# Patient Record
Sex: Male | Born: 1975 | Race: White | Hispanic: No | Marital: Married | Smoking: Never smoker
Health system: Southern US, Community
[De-identification: ages and names within clinical notes are randomized; demographics above are authoritative.]

## PROBLEM LIST (undated history)

## (undated) DIAGNOSIS — I440 Atrioventricular block, first degree: Secondary | ICD-10-CM

## (undated) HISTORY — DX: Atrioventricular block, first degree: I44.0

## (undated) HISTORY — PX: OTHER SURGICAL HISTORY: SHX169

---

## 2011-07-04 ENCOUNTER — Emergency Department (HOSPITAL_BASED_OUTPATIENT_CLINIC_OR_DEPARTMENT_OTHER)
Admission: EM | Admit: 2011-07-04 | Discharge: 2011-07-04 | Disposition: A | Discharge status: Home / Self Care | Payer: BC Managed Care – PPO | Attending: Emergency Medicine | Admitting: Emergency Medicine

## 2011-07-04 ENCOUNTER — Encounter: Payer: Self-pay | Admitting: *Deleted

## 2011-07-04 DIAGNOSIS — R509 Fever, unspecified: Secondary | ICD-10-CM | POA: Insufficient documentation

## 2011-07-04 DIAGNOSIS — J020 Streptococcal pharyngitis: Secondary | ICD-10-CM | POA: Insufficient documentation

## 2011-07-04 LAB — RAPID STREP SCREEN (MED CTR MEBANE ONLY): Streptococcus, Group A Screen (Direct): NEGATIVE

## 2011-07-04 MED ORDER — PENICILLIN G BENZATHINE 1200000 UNIT/2ML IM SUSP
INTRAMUSCULAR | Status: AC
  Administered 2011-07-04: 1.2 10*6.[IU] via INTRAMUSCULAR
  Filled 2011-07-04: qty 2

## 2011-07-04 MED ORDER — PENICILLIN G BENZATHINE 1200000 UNIT/2ML IM SUSP
1.2000 10*6.[IU] | Freq: Once | INTRAMUSCULAR | Status: AC
  Administered 2011-07-04: 1.2 10*6.[IU] via INTRAMUSCULAR

## 2011-07-04 NOTE — ED Provider Notes (Signed)
I saw and evaluated the patient, reviewed the resident's note and I agree with the findings and plan.   .Face to face Exam:  General:  Awake HEENT:  Atraumatic Resp:  Normal effort Abd:  Nondistended Neuro:No focal weakness Lymph: No adenopathy   Nelia Shi, MD 07/04/11 1504

## 2011-07-04 NOTE — ED Provider Notes (Addendum)
History     CSN: [id number] Arrival date & time: 07/04/2011  7:31 AM   First MD Initiated Contact with Patient 07/04/11 [phone]      Chief Complaint  Patient presents with  . Sore Throat  . Fever    (Consider location/radiation/quality/duration/timing/severity/associated sxs/prior treatment) HPI 35 year old otherwise healthy man who has been feeling poorly for roughly 5 days. Patient says he started with a sore throat, developed subjective fevers over the weekend, and now is having extremely painful throat both with swallowing and at rest. He denies rhinorrhea or cough, but says he has a swollen lymph node with pain radiating into the right ear. He denies any sick contacts. He is otherwise healthy.  History reviewed. No pertinent past medical history.  History reviewed. No pertinent past surgical history.  History reviewed. No pertinent family history.  History  Substance Use Topics  . Smoking status: Never Smoker   . Smokeless tobacco: Not on file  . Alcohol Use: Yes     Review of Systems  Allergies  Review of patient's allergies indicates no known allergies.  Home Medications  No current outpatient prescriptions on file.  BP 154/77  Pulse 77  Temp(Src) 98.7 F (37.1 C) (Oral)  Resp 20  SpO2 100%  Physical Exam Gen: NAD, cooperative HEENT: NCAT, R TM partially obscrubed by cerumen, but visualized portion was clear and non-erythematous.  L TM completely obscured.  Oropharynx erythematous, bilateral tonsillar erythema with exudate and edema. Positive lymphadenopathy on the right submandibular region. EOMI, PERRLA. ED Course  Procedures (including critical care time)   Labs Reviewed  RAPID STREP SCREEN   No results found.   No diagnosis found.    MDM  35 year old otherwise healthy male complaining of acute pharyngitis who meets all of the centor criteria. A rapid strep was already ordered, but we will give him antibiotics regardless of the results given his  score of 4. Patient prefers injection of antibiotics IM here in the ED, so we will proceed with 1.2 million units of long-acting Bicillin x1 now.   Kathreen Cosier, MD 07/04/11 8295  Kathreen Cosier, MD 07/04/11 [phone]

## 2011-07-04 NOTE — ED Notes (Signed)
Pt amb to room 4 with quick steady gait in nad. Pt reports throat pain onset Friday night, increasing with intermittant fevers at home, taking tylenol, last dose yesterday. Denies any cough, congestion or other c/o.

## 2011-07-06 ENCOUNTER — Encounter (HOSPITAL_BASED_OUTPATIENT_CLINIC_OR_DEPARTMENT_OTHER): Payer: Self-pay | Admitting: Emergency Medicine

## 2011-07-06 ENCOUNTER — Emergency Department (HOSPITAL_BASED_OUTPATIENT_CLINIC_OR_DEPARTMENT_OTHER)
Admission: EM | Admit: 2011-07-06 | Discharge: 2011-07-06 | Disposition: A | Discharge status: Home / Self Care | Payer: BC Managed Care – PPO | Attending: Emergency Medicine | Admitting: Emergency Medicine

## 2011-07-06 DIAGNOSIS — J029 Acute pharyngitis, unspecified: Secondary | ICD-10-CM

## 2011-07-06 LAB — MONONUCLEOSIS SCREEN: Mono Screen: NEGATIVE

## 2011-07-06 MED ORDER — HYDROCODONE-ACETAMINOPHEN 5-500 MG PO TABS: 1.0000 | ORAL_TABLET | Freq: Four times a day (QID) | ORAL | Status: AC | PRN

## 2011-07-06 MED ORDER — SODIUM CHLORIDE 0.9 % IV BOLUS (SEPSIS)
1000.0000 mL | Freq: Once | INTRAVENOUS | Status: AC
  Administered 2011-07-06: 1000 mL via INTRAVENOUS

## 2011-07-06 NOTE — ED Provider Notes (Signed)
History     CSN: [id number] Arrival date & time: 07/06/2011  4:30 PM   First MD Initiated Contact with Patient 07/06/11 1639      Chief Complaint  Patient presents with  . Sore Throat    (Consider location/radiation/quality/duration/timing/severity/associated sxs/prior treatment) HPI Comments: Pt states that he was seen 2 days ago and treated for strep:pt states that usually he feels better but he is continuing to not feel good at this time  Patient is a 35 y.o. male presenting with pharyngitis. The history is provided by the patient. No language interpreter was used.  Sore Throat This is a new problem. The current episode started in the past 7 days. The problem occurs constantly. The problem has been unchanged. Associated symptoms include anorexia, fatigue and a fever. Pertinent negatives include no abdominal pain or coughing. The symptoms are aggravated by swallowing. He has tried nothing for the symptoms.  Sore Throat This is a new problem. The current episode started in the past 7 days. The problem occurs constantly. The problem has been unchanged. Pertinent negatives include no abdominal pain. The symptoms are aggravated by swallowing. He has tried nothing for the symptoms.    History reviewed. No pertinent past medical history.  History reviewed. No pertinent past surgical history.  No family history on file.  History  Substance Use Topics  . Smoking status: Never Smoker   . Smokeless tobacco: Not on file  . Alcohol Use: Yes     weekly      Review of Systems  Constitutional: Positive for fever and fatigue.  Respiratory: Negative for cough.   Gastrointestinal: Positive for anorexia. Negative for abdominal pain.  All other systems reviewed and are negative.    Allergies  Review of patient's allergies indicates no known allergies.  Home Medications  No current outpatient prescriptions on file.  BP 144/81  Pulse 84  Temp(Src) 98.9 F (37.2 C) (Oral)  Resp 16   Ht 5\' 10"  (1.778 m)  Wt 211 lb (95.709 kg)  BMI 30.28 kg/m2  SpO2 99%  Physical Exam  Nursing note and vitals reviewed. Constitutional: He is oriented to person, place, and time. He appears well-developed and well-nourished.  HENT:  Right Ear: External ear normal.  Left Ear: External ear normal.  Mouth/Throat: No tonsillar abscesses.       Pt has swelling and exudate noted to the right tonsil without abscess at this time  Cardiovascular: Normal rate and regular rhythm.   Pulmonary/Chest: Effort normal and breath sounds normal.  Abdominal: Soft.  Musculoskeletal: Normal range of motion.  Neurological: He is oriented to person, place, and time.  Skin: Skin is warm and dry.    ED Course  Procedures (including critical care time)   Labs Reviewed  MONONUCLEOSIS SCREEN   No results found.   1. Pharyngitis       MDM  Pt treated 2 days go for strep:mono negative:pt is okay to follow up as needed:will treat symptomatically:no sign of tonsillar abscess at this time     Medical screening examination/treatment/procedure(s) were performed by non-physician practitioner and as supervising physician I was immediately available for consultation/collaboration. Osvaldo Human, M.D.    Teressa Lower, NP 07/06/11 1841  Carleene Cooper III, MD 07/06/11 2107

## 2011-07-06 NOTE — ED Notes (Signed)
Pt states was seen in ER Wed. And has not improved from that visit. C/O sore throat fever and "sweats" since seen. Denies c/o SOB, reports feeling weak and loss of appetite.

## 2015-04-03 ENCOUNTER — Encounter (HOSPITAL_BASED_OUTPATIENT_CLINIC_OR_DEPARTMENT_OTHER): Payer: Self-pay | Admitting: Emergency Medicine

## 2015-04-03 ENCOUNTER — Emergency Department (HOSPITAL_BASED_OUTPATIENT_CLINIC_OR_DEPARTMENT_OTHER): Payer: BLUE CROSS/BLUE SHIELD

## 2015-04-03 ENCOUNTER — Emergency Department (HOSPITAL_BASED_OUTPATIENT_CLINIC_OR_DEPARTMENT_OTHER)
Admission: EM | Admit: 2015-04-03 | Discharge: 2015-04-03 | Disposition: A | Discharge status: Home / Self Care | Payer: BLUE CROSS/BLUE SHIELD | Attending: Emergency Medicine | Admitting: Emergency Medicine

## 2015-04-03 DIAGNOSIS — R42 Dizziness and giddiness: Secondary | ICD-10-CM | POA: Insufficient documentation

## 2015-04-03 DIAGNOSIS — R0602 Shortness of breath: Secondary | ICD-10-CM | POA: Insufficient documentation

## 2015-04-03 LAB — CBC WITH DIFFERENTIAL/PLATELET
BASOS ABS: 0 10*3/uL (ref 0.0–0.1)
BASOS PCT: 0 % (ref 0–1)
Eosinophils Absolute: 0.2 10*3/uL (ref 0.0–0.7)
Eosinophils Relative: 2 % (ref 0–5)
HEMATOCRIT: 40.6 % (ref 39.0–52.0)
HEMOGLOBIN: 14.3 g/dL (ref 13.0–17.0)
Lymphocytes Relative: 24 % (ref 12–46)
Lymphs Abs: 2.2 10*3/uL (ref 0.7–4.0)
MCH: 30.7 pg (ref 26.0–34.0)
MCHC: 35.2 g/dL (ref 30.0–36.0)
MCV: 87.1 fL (ref 78.0–100.0)
Monocytes Absolute: 0.9 10*3/uL (ref 0.1–1.0)
Monocytes Relative: 10 % (ref 3–12)
NEUTROS ABS: 5.7 10*3/uL (ref 1.7–7.7)
NEUTROS PCT: 64 % (ref 43–77)
Platelets: 263 10*3/uL (ref 150–400)
RBC: 4.66 MIL/uL (ref 4.22–5.81)
RDW: 11.6 % (ref 11.5–15.5)
WBC: 9 10*3/uL (ref 4.0–10.5)

## 2015-04-03 LAB — BASIC METABOLIC PANEL
ANION GAP: 8 (ref 5–15)
BUN: 14 mg/dL (ref 6–20)
CALCIUM: 9.1 mg/dL (ref 8.9–10.3)
CO2: 29 mmol/L (ref 22–32)
Chloride: 101 mmol/L (ref 101–111)
Creatinine, Ser: 0.88 mg/dL (ref 0.61–1.24)
Glucose, Bld: 111 mg/dL — ABNORMAL HIGH (ref 65–99)
Potassium: 3.6 mmol/L (ref 3.5–5.1)
Sodium: 138 mmol/L (ref 135–145)

## 2015-04-03 LAB — D-DIMER, QUANTITATIVE (NOT AT ARMC)

## 2015-04-03 LAB — TROPONIN I: Troponin I: <0.03 ng/mL (ref <0.031)

## 2015-04-03 MED ORDER — IOHEXOL 350 MG/ML SOLN
100.0000 mL | Freq: Once | INTRAVENOUS | Status: AC | PRN
  Administered 2015-04-03: 100 mL via INTRAVENOUS

## 2015-04-03 MED ORDER — ALBUTEROL SULFATE (2.5 MG/3ML) 0.083% IN NEBU
5.0000 mg | INHALATION_SOLUTION | Freq: Once | RESPIRATORY_TRACT | Status: DC
Start: 2015-04-03 — End: 2015-04-03

## 2015-04-03 NOTE — ED Notes (Signed)
Pt reports sob and tachycardia while flying to [place] on tues, returned on Saturday reports acute onset of sob, dizziness and tachycardia

## 2015-04-03 NOTE — ED Notes (Signed)
Pt's SpO2 remained between 97% and 98% when walking.

## 2015-04-03 NOTE — Discharge Instructions (Signed)

## 2015-04-03 NOTE — ED Provider Notes (Signed)
CSN: [id number]     Arrival date & time 04/03/15  1308 History   First MD Initiated Contact with Patient 04/03/15 1324     Chief Complaint  Patient presents with  . Shortness of Breath    HPI  39 yo healthy male presents with acute onset dyspnea on exertion that started 3 days ago after a long 9 hour flight to [place]. He has an apple watch that showed his resting heart rate had increased from 60s to 90s since that time. Otherwise he denies chest pain, leg pain or swelling, wheezing, fevers, or cough.  History reviewed. No pertinent past medical history. History reviewed. No pertinent past surgical history. History reviewed. No pertinent family history.   Social History  Substance Use Topics  . Smoking status: Never Smoker   . Smokeless tobacco: None  . Alcohol Use: Yes     Comment: weekly    Review of Systems  Constitutional: Negative for fever and chills.  Respiratory: Positive for shortness of breath. Negative for chest tightness and wheezing.   Cardiovascular: Negative for chest pain, palpitations and leg swelling.  Gastrointestinal: Negative for abdominal pain.  Neurological: Positive for light-headedness. Negative for dizziness.  Hematological: Negative for adenopathy.    Allergies  Review of patient's allergies indicates no known allergies.  Home Medications   Prior to Admission medications   Not on File   BP 147/76 mmHg  Pulse 74  Temp(Src) 98.2 F (36.8 C) (Oral)  Resp 18  Ht 5\' 10"  (1.778 m)  Wt 217 lb (98.431 kg)  BMI 31.14 kg/m2  SpO2 98%   Physical Exam  Constitutional: He appears well-developed and well-nourished.  HENT:  Head: Normocephalic and atraumatic.  Eyes: Conjunctivae and EOM are normal.  Neck: Normal range of motion. Neck supple.  Cardiovascular: Normal rate, regular rhythm and normal heart sounds.   No murmur heard. Pulmonary/Chest: Effort normal and breath sounds normal. He has no wheezes.  Abdominal: Soft. Bowel sounds are normal.   Musculoskeletal: Normal range of motion.  Skin: Skin is warm and dry.  Psychiatric: He has a normal mood and affect.    ED Course  Procedures (including critical care time)  Labs Review Labs Reviewed  BASIC METABOLIC PANEL - Abnormal; Notable for the following:    Glucose, Bld 111 (*)    All other components within normal limits  D-DIMER, QUANTITATIVE (NOT AT [facility])  CBC WITH DIFFERENTIAL/PLATELET  TROPONIN I    Imaging Review No results found. I have personally reviewed and evaluated these images and lab results as part of my medical decision-making.  EKG: Rate 90, first degree AV block, normal axis, no ST changes  MDM   Final diagnoses:  Shortness of breath    Healthy 39 year old male presented with dyspnea on exertion after a long flight; D-dimer was negative, EKG showed only first degree AV block, and Chest CTA was negative for pulmonary disease but could not confidently rule out PE because the contrast timing was off. We felt confident he was NOT having a PE because his D-dimer was negative. Cardiac causes is unlikely given low HEART score of 1, EKG showing only first degree AV block, and negative troponin. Moreover, I don't have a great idea what's causing his dyspnea on exertion so I recommended he get a PCP or cardiologist and try to get an appointment later this week. They may feel he qualifies for a stress test. Regardless, it would be a good idea to establish care.  Selina Cooley,  MD 04/03/15 1610  Rolan Bucco, MD 04/03/15 1539

## 2015-04-05 ENCOUNTER — Encounter: Payer: Self-pay | Admitting: *Deleted

## 2015-04-05 ENCOUNTER — Telehealth: Payer: Self-pay | Admitting: *Deleted

## 2015-04-05 NOTE — Telephone Encounter (Signed)
no answer, need fam med hx, medical & surgical hx.. pt coming over from hospital..

## 2015-04-06 ENCOUNTER — Ambulatory Visit: Payer: BLUE CROSS/BLUE SHIELD | Admitting: Cardiology

## 2015-04-11 ENCOUNTER — Telehealth: Payer: Self-pay | Admitting: Internal Medicine

## 2015-04-11 ENCOUNTER — Encounter: Payer: Self-pay | Admitting: Internal Medicine

## 2015-04-11 NOTE — Telephone Encounter (Signed)
Called pt to get medical and family history from the pt and also reminded him about his upcoming appointment on 04/15/2015 at 2:30 pm. I advised the pt that if he has any other problems, questions or concerns to call the office. Pt verbalized understanding.

## 2015-04-15 ENCOUNTER — Ambulatory Visit (INDEPENDENT_AMBULATORY_CARE_PROVIDER_SITE_OTHER): Payer: BLUE CROSS/BLUE SHIELD | Admitting: Internal Medicine

## 2015-04-15 ENCOUNTER — Encounter: Payer: Self-pay | Admitting: Internal Medicine

## 2015-04-15 VITALS — BP 126/78 | HR 72 | Wt 224.0 lb

## 2015-04-15 DIAGNOSIS — R0602 Shortness of breath: Secondary | ICD-10-CM

## 2015-04-15 NOTE — Patient Instructions (Signed)
Your physician recommends that you schedule a follow-up appointment as needed with Dr. Ross.   

## 2015-04-15 NOTE — Progress Notes (Signed)
   Cardiology Office Note   Date:  04/15/2015   ID:  Mike Nicholson, DOB 1975/10/15, MRN [id number]  PCP:  No primary care provider on file.  Cardiologist:   Dietrich Pates, MD   No chief complaint on file.  Pt presents for continued f/u from ER     History of Present Illness: Mike Nicholson is a 39 y.o. male with a history of SOB   Referred from ER for continued eval  Pt went to ER on 9/4 with 3 day history of SOB  Prior to that had 9 hour flight.    No CP  D dimer negative   CT of chest was suboptimal in timing but no obvious PE found     Since ER visit he is increasing his activity with jogging  Feels that his breathing is improving    He denies CP      No current outpatient prescriptions on file.   No current facility-administered medications for this visit.    Allergies:   Review of patient's allergies indicates no known allergies.   Past Medical History  Diagnosis Date  . First degree AV block     Past Surgical History  Procedure Laterality Date  . None       Social History:  The patient does not smoke  Does use wet tobacco for chew  Family History:  The patient has no history of premature CAD in family    ROS:  Please see the history of present illness. All other systems are reviewed and  Negative to the above problem except as noted.    PHYSICAL EXAM: VS:  BP 126/78 mmHg  Pulse 72  Wt 224 lb (101.606 kg)  SpO2 97%  GEN: Well nourished, well developed, in no acute distress HEENT: normal Neck: no JVD, carotid bruits, or masses Cardiac: RRR; no murmurs, rubs, or gallops,no edema  Respiratory:  clear to auscultation bilaterally, normal work of breathing GI: soft, nontender, nondistended, + BS  No hepatomegaly  MS: no deformity Moving all extremities   Skin: warm and dry, no rash Neuro:  Strength and sensation are intact Psych: euthymic mood, full affect   EKG:  EKG is not ordered today.  On 9/4  SR 84  First degree AV bllck     Lipid Panel No  results found for: CHOL, TRIG, HDL, CHOLHDL, VLDL, LDLCALC, LDLDIRECT    Wt Readings from Last 3 Encounters:  04/15/15 224 lb (101.606 kg)  04/03/15 217 lb (98.431 kg)  07/06/11 211 lb (95.709 kg)      ASSESSMENT AND PLAN:  1.  SOB  I am not convinced there is any problem.  Breathing appears to be improving with training  Follow  If worsens or recurs then he should call.  WIll not sched any testing for now  2.  Tob  Counselled on cessation of chew tobacco  3.  HCM  WIll need to check on lipids  He will forward.    Should have IM physician  F/U with me prn     Signed, Dietrich Pates, MD  04/15/2015 3:28 PM    [facility] [address] [place], [place], [place]  [address] Phone: [phone]; Fax: [phone]

## 2017-02-25 IMAGING — CR DG CHEST 2V
2 series · 2 of 2 positions shown · non-contrast
Comparison: None.

CLINICAL DATA: Shortness of breath. Tachycardia. Recent airplane
flight.

EXAM:
CHEST  2 VIEW

[w chest pa]
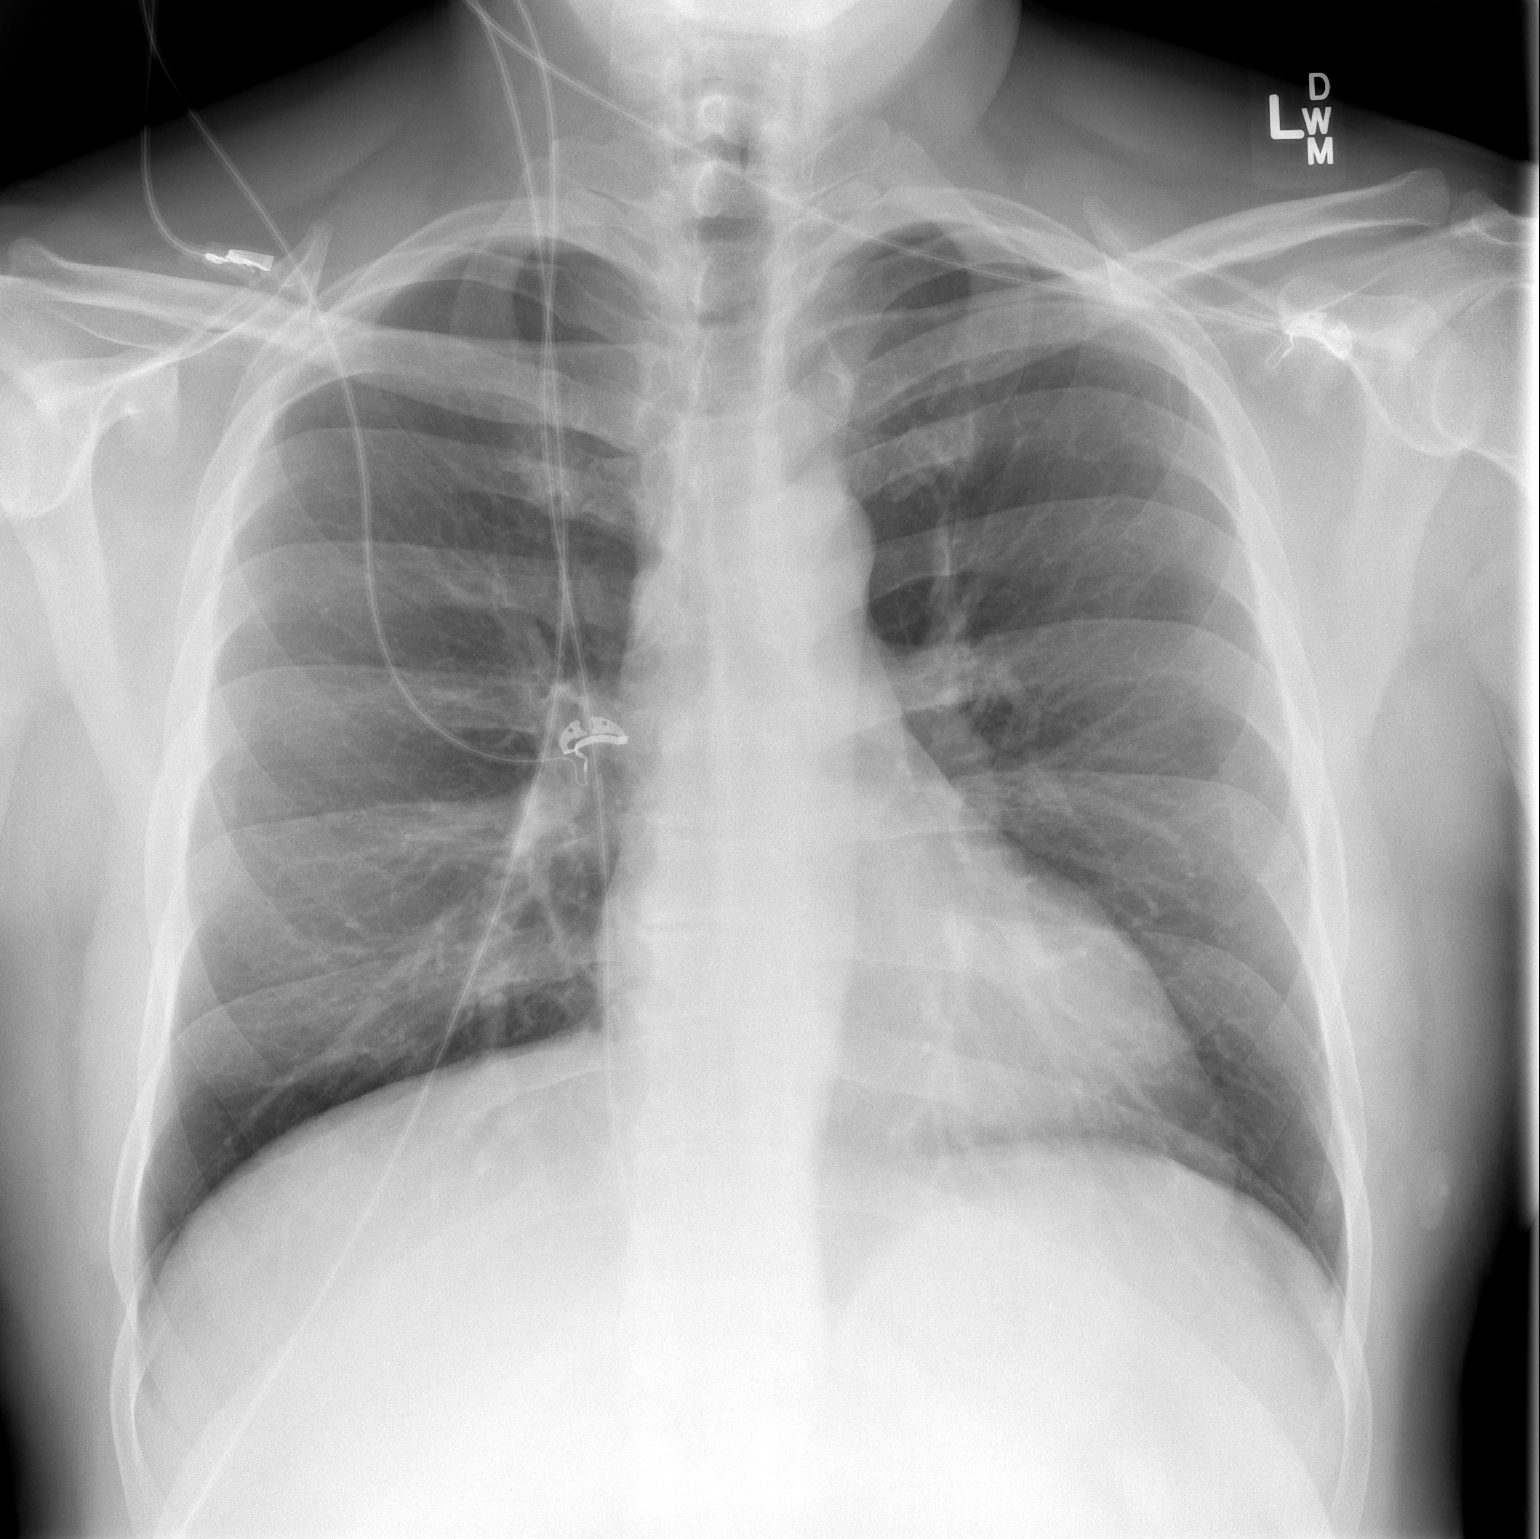

[w chest lat]
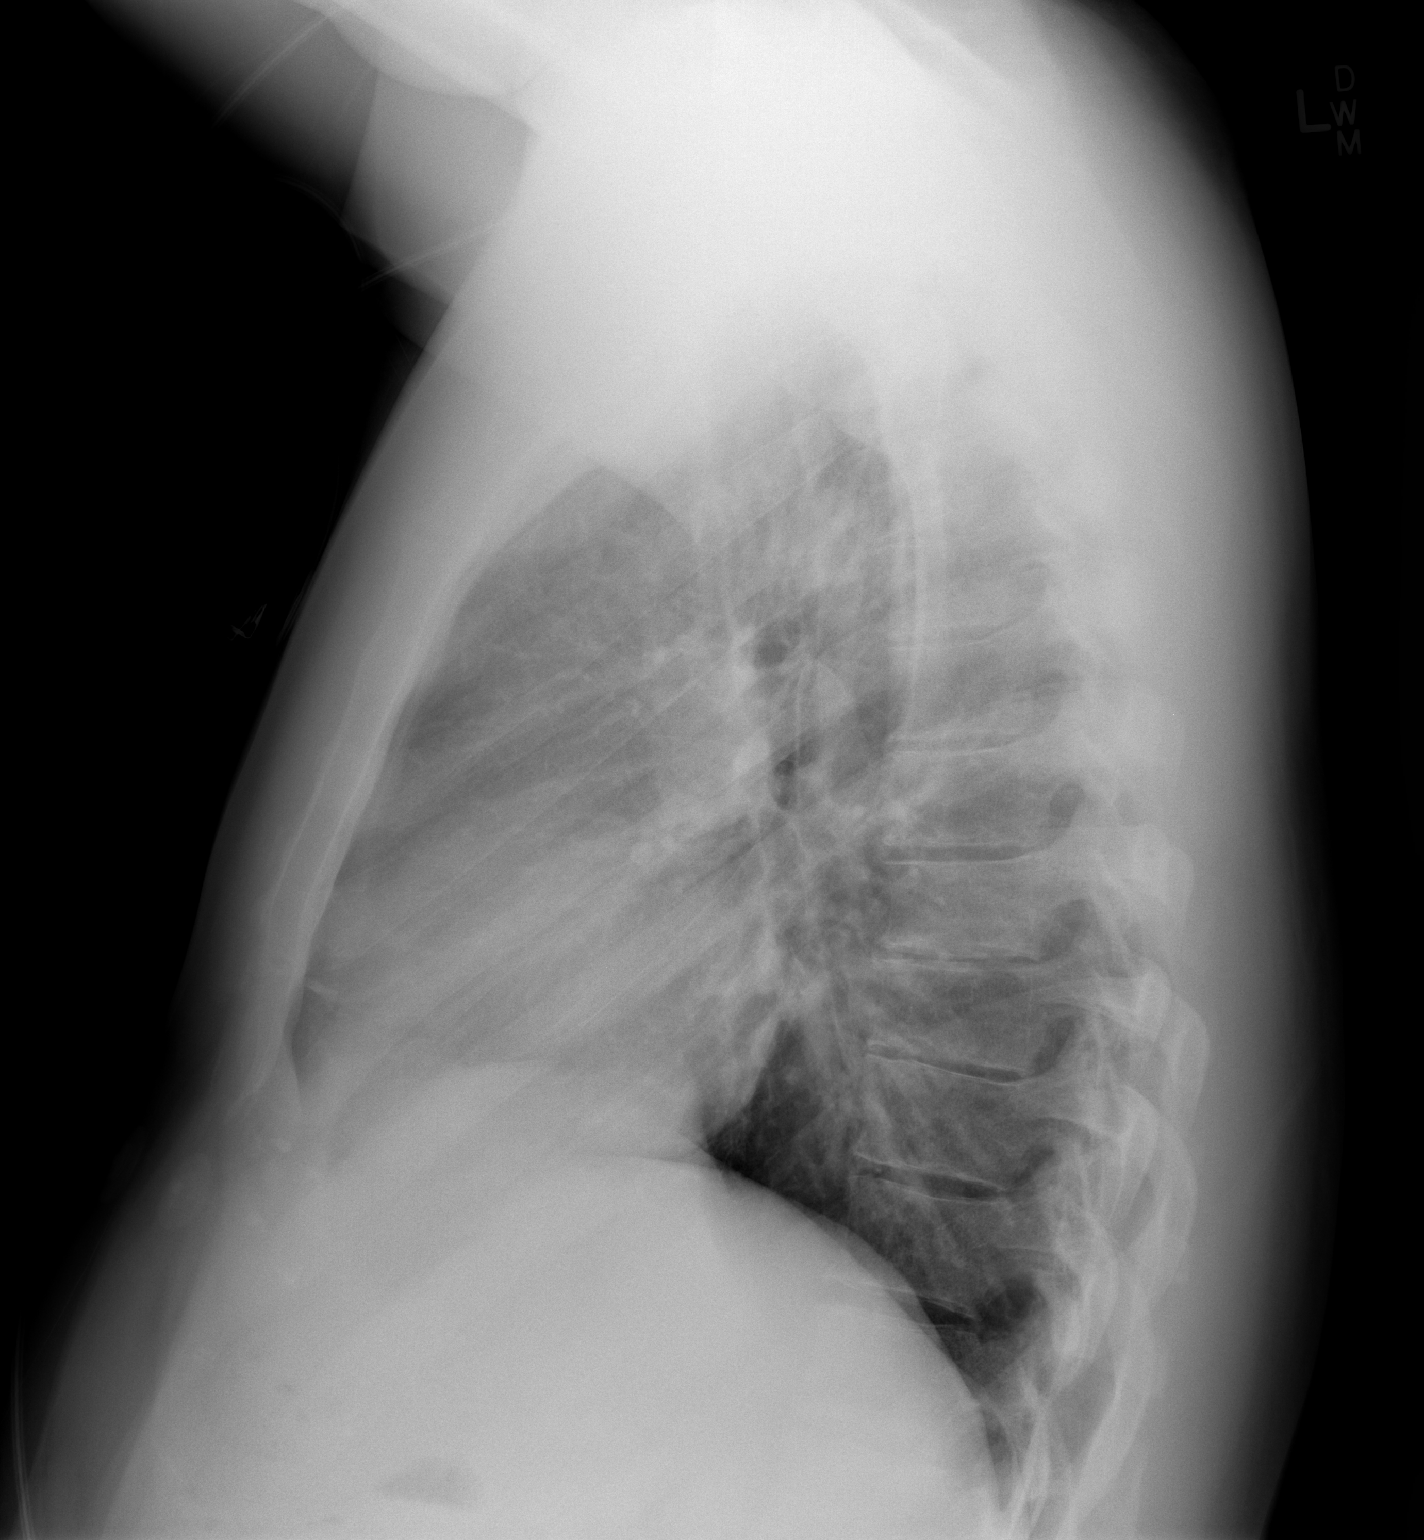

[2 of 2 positions shown; findings below may reference images not displayed]

FINDINGS: The heart size and mediastinal contours are within normal limits.
Both lungs are clear. The visualized skeletal structures are
unremarkable.
IMPRESSION: No active cardiopulmonary disease.

## 2018-07-28 DIAGNOSIS — E291 Testicular hypofunction: Secondary | ICD-10-CM | POA: Diagnosis not present

## 2018-08-28 DIAGNOSIS — F4322 Adjustment disorder with anxiety: Secondary | ICD-10-CM | POA: Diagnosis not present

## 2018-09-16 DIAGNOSIS — R5383 Other fatigue: Secondary | ICD-10-CM | POA: Diagnosis not present

## 2018-09-16 DIAGNOSIS — R7989 Other specified abnormal findings of blood chemistry: Secondary | ICD-10-CM | POA: Diagnosis not present

## 2018-09-18 DIAGNOSIS — N5201 Erectile dysfunction due to arterial insufficiency: Secondary | ICD-10-CM | POA: Diagnosis not present

## 2018-12-25 DIAGNOSIS — R6882 Decreased libido: Secondary | ICD-10-CM | POA: Diagnosis not present

## 2018-12-25 DIAGNOSIS — R5383 Other fatigue: Secondary | ICD-10-CM | POA: Diagnosis not present

## 2018-12-25 DIAGNOSIS — R635 Abnormal weight gain: Secondary | ICD-10-CM | POA: Diagnosis not present

## 2018-12-25 DIAGNOSIS — E639 Nutritional deficiency, unspecified: Secondary | ICD-10-CM | POA: Diagnosis not present

## 2018-12-25 DIAGNOSIS — E559 Vitamin D deficiency, unspecified: Secondary | ICD-10-CM | POA: Diagnosis not present

## 2018-12-30 DIAGNOSIS — E559 Vitamin D deficiency, unspecified: Secondary | ICD-10-CM | POA: Diagnosis not present

## 2018-12-30 DIAGNOSIS — R5383 Other fatigue: Secondary | ICD-10-CM | POA: Diagnosis not present

## 2018-12-30 DIAGNOSIS — E639 Nutritional deficiency, unspecified: Secondary | ICD-10-CM | POA: Diagnosis not present

## 2018-12-30 DIAGNOSIS — E291 Testicular hypofunction: Secondary | ICD-10-CM | POA: Diagnosis not present

## 2019-01-27 DIAGNOSIS — R41840 Attention and concentration deficit: Secondary | ICD-10-CM | POA: Diagnosis not present

## 2019-01-27 DIAGNOSIS — Z125 Encounter for screening for malignant neoplasm of prostate: Secondary | ICD-10-CM | POA: Diagnosis not present

## 2019-01-27 DIAGNOSIS — Z1389 Encounter for screening for other disorder: Secondary | ICD-10-CM | POA: Diagnosis not present

## 2019-01-27 DIAGNOSIS — Z0000 Encounter for general adult medical examination without abnormal findings: Secondary | ICD-10-CM | POA: Diagnosis not present

## 2019-01-27 DIAGNOSIS — Z136 Encounter for screening for cardiovascular disorders: Secondary | ICD-10-CM | POA: Diagnosis not present

## 2019-01-27 DIAGNOSIS — Z131 Encounter for screening for diabetes mellitus: Secondary | ICD-10-CM | POA: Diagnosis not present

## 2019-01-27 DIAGNOSIS — E669 Obesity, unspecified: Secondary | ICD-10-CM | POA: Diagnosis not present

## 2019-02-02 DIAGNOSIS — R5383 Other fatigue: Secondary | ICD-10-CM | POA: Diagnosis not present

## 2019-02-02 DIAGNOSIS — N5089 Other specified disorders of the male genital organs: Secondary | ICD-10-CM | POA: Diagnosis not present

## 2019-02-02 DIAGNOSIS — R6882 Decreased libido: Secondary | ICD-10-CM | POA: Diagnosis not present

## 2019-03-04 DIAGNOSIS — F909 Attention-deficit hyperactivity disorder, unspecified type: Secondary | ICD-10-CM | POA: Diagnosis not present

## 2019-04-07 DIAGNOSIS — F909 Attention-deficit hyperactivity disorder, unspecified type: Secondary | ICD-10-CM | POA: Diagnosis not present

## 2019-06-02 DIAGNOSIS — F909 Attention-deficit hyperactivity disorder, unspecified type: Secondary | ICD-10-CM | POA: Diagnosis not present

## 2019-06-22 DIAGNOSIS — L821 Other seborrheic keratosis: Secondary | ICD-10-CM | POA: Diagnosis not present

## 2019-06-22 DIAGNOSIS — D1801 Hemangioma of skin and subcutaneous tissue: Secondary | ICD-10-CM | POA: Diagnosis not present

## 2019-06-22 DIAGNOSIS — D485 Neoplasm of uncertain behavior of skin: Secondary | ICD-10-CM | POA: Diagnosis not present

## 2019-06-22 DIAGNOSIS — D225 Melanocytic nevi of trunk: Secondary | ICD-10-CM | POA: Diagnosis not present

## 2019-07-22 DIAGNOSIS — D485 Neoplasm of uncertain behavior of skin: Secondary | ICD-10-CM | POA: Diagnosis not present

## 2019-07-22 DIAGNOSIS — L905 Scar conditions and fibrosis of skin: Secondary | ICD-10-CM | POA: Diagnosis not present

## 2019-08-31 DIAGNOSIS — F909 Attention-deficit hyperactivity disorder, unspecified type: Secondary | ICD-10-CM | POA: Diagnosis not present

## 2019-11-26 DIAGNOSIS — F909 Attention-deficit hyperactivity disorder, unspecified type: Secondary | ICD-10-CM | POA: Diagnosis not present

## 2019-12-01 DIAGNOSIS — R5383 Other fatigue: Secondary | ICD-10-CM | POA: Diagnosis not present

## 2019-12-01 DIAGNOSIS — E291 Testicular hypofunction: Secondary | ICD-10-CM | POA: Diagnosis not present

## 2019-12-01 DIAGNOSIS — E559 Vitamin D deficiency, unspecified: Secondary | ICD-10-CM | POA: Diagnosis not present

## 2019-12-01 DIAGNOSIS — E538 Deficiency of other specified B group vitamins: Secondary | ICD-10-CM | POA: Diagnosis not present

## 2019-12-15 DIAGNOSIS — M7701 Medial epicondylitis, right elbow: Secondary | ICD-10-CM | POA: Diagnosis not present

## 2020-02-23 DIAGNOSIS — F909 Attention-deficit hyperactivity disorder, unspecified type: Secondary | ICD-10-CM | POA: Diagnosis not present

## 2020-05-02 DIAGNOSIS — M7701 Medial epicondylitis, right elbow: Secondary | ICD-10-CM | POA: Diagnosis not present

## 2020-05-12 DIAGNOSIS — M25521 Pain in right elbow: Secondary | ICD-10-CM | POA: Diagnosis not present

## 2020-05-17 DIAGNOSIS — F909 Attention-deficit hyperactivity disorder, unspecified type: Secondary | ICD-10-CM | POA: Diagnosis not present

## 2023-04-24 ENCOUNTER — Ambulatory Visit: Payer: BC Managed Care – PPO | Attending: Orthopaedic Surgery | Admitting: Physical Therapy

## 2023-04-24 ENCOUNTER — Encounter: Payer: Self-pay | Admitting: Physical Therapy

## 2023-04-24 DIAGNOSIS — M6281 Muscle weakness (generalized): Secondary | ICD-10-CM | POA: Insufficient documentation

## 2023-04-24 DIAGNOSIS — R262 Difficulty in walking, not elsewhere classified: Secondary | ICD-10-CM | POA: Diagnosis present

## 2023-04-24 DIAGNOSIS — M25671 Stiffness of right ankle, not elsewhere classified: Secondary | ICD-10-CM | POA: Diagnosis present

## 2023-04-24 NOTE — Therapy (Signed)
OUTPATIENT PHYSICAL THERAPY LOWER EXTREMITY EVALUATION   Patient Name: Mike Nicholson MRN: [id number] DOB:1976-06-02, 47 y.o., male Today's Date: 04/24/2023  END OF SESSION:  PT End of Session - 04/24/23 1453     Visit Number 1    Date for PT Re-Evaluation 07/24/23    Authorization Type [employer]    PT Start Time 1445    PT Stop Time 1530    PT Time Calculation (min) 45 min    Activity Tolerance Patient tolerated treatment well    Behavior During Therapy WFL for tasks assessed/performed             Past Medical History:  Diagnosis Date   First degree AV block    Past Surgical History:  Procedure Laterality Date   none     There are no problems to display for this patient.   PCP: Odis Hollingshead, MD  REFERRING PROVIDER: Odis Hollingshead, MD  REFERRING DIAG: s/p right achilles tendon repair  THERAPY DIAG:  Stiffness of right ankle, not elsewhere classified  Difficulty in walking, not elsewhere classified  Muscle weakness (generalized)  Rationale for Evaluation and Treatment: Rehabilitation  ONSET DATE: 12/14/22  SUBJECTIVE:   SUBJECTIVE STATEMENT: Playing soccer May 8th, felt pain and pop, did not have surgery until 12/14/22 for a right achilles tendon repair.  Casted 5 weeks and NWB with crutches, then in a boot 5 weeks.  He reports that he is doing okay, just does not have strength  PERTINENT HISTORY: none PAIN:  Are you having pain? Yes: NPRS scale: 0/10 Pain location: right achilles Pain description: sore/ ache Aggravating factors: walking trying to raise up on toes Relieving factors: rest,   PRECAUTIONS: None  RED FLAGS: None   WEIGHT BEARING RESTRICTIONS: No  FALLS:  Has patient fallen in last 6 months? No  LIVING ENVIRONMENT: Lives with: lives with their family Lives in: House/apartment Stairs: Yes: Internal: 12 steps; can reach both tends to do one at a time  Has following equipment at home: None  OCCUPATION: office  PLOF: Independent and some  weights, did some soccer , golf 1x/week, tennis 1x/week  PATIENT GOALS: be stronger  NEXT MD VISIT: 6 weeks  OBJECTIVE:   DIAGNOSTIC FINDINGS: tendon rupture  PATIENT SURVEYS:  FOTO 58  COGNITION: Overall cognitive status: Within functional limits for tasks assessed     SENSATION: WFL  EDEMA:  Mild achilles swelling  MUSCLE LENGTH: Tight calves   POSTURE: weight shift left  PALPATION: Decreased mm tone  LOWER EXTREMITY ROM:  Active ROM Right eval Left eval  Hip flexion    Hip extension    Hip abduction    Hip adduction    Hip internal rotation    Hip external rotation    Knee flexion    Knee extension    Ankle dorsiflexion 11   Ankle plantarflexion    Ankle inversion    Ankle eversion     (Blank rows = not tested)  LOWER EXTREMITY MMT:  MMT Right eval Left eval  Hip flexion    Hip extension    Hip abduction    Hip adduction    Hip internal rotation    Hip external rotation    Knee flexion    Knee extension    Ankle dorsiflexion 4+   Ankle plantarflexion 3+ Cannot do a right leg calf raisex1, cannot get heel off the ground   Ankle inversion    Ankle eversion     (Blank rows = not tested)  LOWER EXTREMITY  SPECIAL TESTS:  Ankle special tests: SLS on the right 10 seconds  FUNCTIONAL TESTS:  5 times sit to stand: 15 seconds Timed up and go (TUG): 11 seconds Right calf circumference 3" below the tibial tubercle 39.5cm , left 40.5 cm GAIT: Distance walked: 100 feet Assistive device utilized: None Level of assistance: Complete Independence Comments: has decreased ROM at toe off, has right hip rotate back at toe off, reports at home he does stairs one at a time   TODAY'S TREATMENT:                                                                                                                              DATE:     PATIENT EDUCATION:  Education details: HEP/POC Person educated: Patient Education method: Programmer, multimedia, Demonstration,  Tactile cues, and Verbal cues Education comprehension: verbalized understanding, returned demonstration, verbal cues required, and tactile cues required  HOME EXERCISE PROGRAM: Black tband ankle PF, eccentric right heel raises  ASSESSMENT:  CLINICAL IMPRESSION: Patient is a 47 y.o. male who was seen today for physical therapy evaluation and treatment for right achilles tendon repair on 12/14/22.  He has good ROM, minor gait issue with decreased toe off and backward hip rotation at toe off, he does stairs one at a time now due to a habit.  He cannot do a single right leg heel raise, cannot get the heel of the ground   OBJECTIVE IMPAIRMENTS: Abnormal gait, cardiopulmonary status limiting activity, decreased activity tolerance, decreased balance, decreased coordination, decreased endurance, decreased mobility, difficulty walking, decreased ROM, decreased strength, increased edema, increased muscle spasms, impaired flexibility, and pain.   REHAB POTENTIAL: Good  CLINICAL DECISION MAKING: Stable/uncomplicated  EVALUATION COMPLEXITY: Low   GOALS: Goals reviewed with patient? Yes  SHORT TERM GOALS: Target date: 05/06/23 Independent with initial HEP Goal status: INITIAL  LONG TERM GOALS: Target date: 07/24/23  Independent with advanced HEP Goal status: INITIAL  2.  Increase right ankle DF to 15 degrees Goal status: INITIAL  3.  Be able to do 10 right single leg heel raises Goal status: INITIAL  4.  Jog and change directions without hesitation Goal status: INITIAL  5.  No pain Goal status: INITIAL  PLAN:  PT FREQUENCY: 1-2x/week  PT DURATION: 12 weeks  PLANNED INTERVENTIONS: Therapeutic exercises, Therapeutic activity, Neuromuscular re-education, Balance training, Gait training, Patient/Family education, Self Care, Joint mobilization, Dry Needling, Electrical stimulation, Cryotherapy, scar mobilization, Taping, Vasopneumatic device, Ultrasound, and Manual therapy  PLAN FOR  NEXT SESSION: work on the ROM, start gym activities and calf strength   Marvalene Barrett W, PT 04/24/2023, 2:54 PM

## 2023-05-07 ENCOUNTER — Ambulatory Visit: Payer: BC Managed Care – PPO | Attending: Orthopaedic Surgery

## 2023-05-07 DIAGNOSIS — R262 Difficulty in walking, not elsewhere classified: Secondary | ICD-10-CM | POA: Insufficient documentation

## 2023-05-07 DIAGNOSIS — M6281 Muscle weakness (generalized): Secondary | ICD-10-CM | POA: Diagnosis present

## 2023-05-07 DIAGNOSIS — M25671 Stiffness of right ankle, not elsewhere classified: Secondary | ICD-10-CM | POA: Insufficient documentation

## 2023-05-07 NOTE — Therapy (Signed)
OUTPATIENT PHYSICAL THERAPY LOWER EXTREMITY TREATMENT   Patient Name: Mike Nicholson MRN: [id number] DOB:April 16, 1976, 47 y.o., male Today's Date: 05/07/2023  END OF SESSION:  PT End of Session - 05/07/23 1718     Visit Number 2    Date for PT Re-Evaluation 07/24/23    Authorization Type [employer]    PT Start Time 1718    PT Stop Time 1800    PT Time Calculation (min) 42 min    Activity Tolerance Patient tolerated treatment well    Behavior During Therapy WFL for tasks assessed/performed              Past Medical History:  Diagnosis Date   First degree AV block    Past Surgical History:  Procedure Laterality Date   none     There are no problems to display for this patient.   PCP: Odis Hollingshead, MD  REFERRING PROVIDER: Odis Hollingshead, MD  REFERRING DIAG: s/p right achilles tendon repair  THERAPY DIAG:  Stiffness of right ankle, not elsewhere classified  Difficulty in walking, not elsewhere classified  Muscle weakness (generalized)  Rationale for Evaluation and Treatment: Rehabilitation  ONSET DATE: 12/14/22  SUBJECTIVE:   SUBJECTIVE STATEMENT: A little bit of pain in the heel. Playing golf now.   PERTINENT HISTORY: none PAIN:  Are you having pain? Yes: NPRS scale: 0/10 Pain location: right achilles Pain description: sore/ ache Aggravating factors: walking trying to raise up on toes Relieving factors: rest,   PRECAUTIONS: None  RED FLAGS: None   WEIGHT BEARING RESTRICTIONS: No  FALLS:  Has patient fallen in last 6 months? No  LIVING ENVIRONMENT: Lives with: lives with their family Lives in: House/apartment Stairs: Yes: Internal: 12 steps; can reach both tends to do one at a time  Has following equipment at home: None  OCCUPATION: office  PLOF: Independent and some weights, did some soccer , golf 1x/week, tennis 1x/week  PATIENT GOALS: be stronger  NEXT MD VISIT: 6 weeks  OBJECTIVE:   DIAGNOSTIC FINDINGS: tendon rupture  PATIENT SURVEYS:   FOTO 58  COGNITION: Overall cognitive status: Within functional limits for tasks assessed     SENSATION: WFL  EDEMA:  Mild achilles swelling  MUSCLE LENGTH: Tight calves   POSTURE: weight shift left  PALPATION: Decreased mm tone  LOWER EXTREMITY ROM:  Active ROM Right eval Left eval  Hip flexion    Hip extension    Hip abduction    Hip adduction    Hip internal rotation    Hip external rotation    Knee flexion    Knee extension    Ankle dorsiflexion 11   Ankle plantarflexion    Ankle inversion    Ankle eversion     (Blank rows = not tested)  LOWER EXTREMITY MMT:  MMT Right eval Left eval  Hip flexion    Hip extension    Hip abduction    Hip adduction    Hip internal rotation    Hip external rotation    Knee flexion    Knee extension    Ankle dorsiflexion 4+   Ankle plantarflexion 3+ Cannot do a right leg calf raisex1, cannot get heel off the ground   Ankle inversion    Ankle eversion     (Blank rows = not tested)  LOWER EXTREMITY SPECIAL TESTS:  Ankle special tests: SLS on the right 10 seconds  FUNCTIONAL TESTS:  5 times sit to stand: 15 seconds Timed up and go (TUG): 11 seconds Right calf circumference 3"  below the tibial tubercle 39.5cm , left 40.5 cm GAIT: Distance walked: 100 feet Assistive device utilized: None Level of assistance: Complete Independence Comments: has decreased ROM at toe off, has right hip rotate back at toe off, reports at home he does stairs one at a time   TODAY'S TREATMENT:                                                                                                                              DATE:   05/07/23 Bike L3x31mins Calf raises on bar 2x12 Calf stretch on slant 30s  AnkleTB green x20 each way  SLS 10s, SLS with catch 8s at best  SLS on airex 10s  Step ups 6" Eccentric heel raises 2x10 Walking on beam  Tandem on foam Resisted gait 30# 4 way x4 20# seated soleus raises 2x10  PATIENT EDUCATION:   Education details: HEP/POC Person educated: Patient Education method: Programmer, multimedia, Demonstration, Tactile cues, and Verbal cues Education comprehension: verbalized understanding, returned demonstration, verbal cues required, and tactile cues required  HOME EXERCISE PROGRAM: Black tband ankle PF, eccentric right heel raises  ASSESSMENT:  CLINICAL IMPRESSION: Patient is a 47 y.o. male who was seen today for physical therapy treatment for right achilles tendon repair on 12/14/22. Focused mostly on ankle and gastroc strengthening. He does have difficulty with balance interventions, especially SL tasks. Still has a hard time with R foot push off. Patient tends to rush through exercises. No reports of pain throughout session.   OBJECTIVE IMPAIRMENTS: Abnormal gait, cardiopulmonary status limiting activity, decreased activity tolerance, decreased balance, decreased coordination, decreased endurance, decreased mobility, difficulty walking, decreased ROM, decreased strength, increased edema, increased muscle spasms, impaired flexibility, and pain.   REHAB POTENTIAL: Good  CLINICAL DECISION MAKING: Stable/uncomplicated  EVALUATION COMPLEXITY: Low   GOALS: Goals reviewed with patient? Yes  SHORT TERM GOALS: Target date: 05/06/23 Independent with initial HEP Goal status: INITIAL  LONG TERM GOALS: Target date: 07/24/23  Independent with advanced HEP Goal status: INITIAL  2.  Increase right ankle DF to 15 degrees Goal status: INITIAL  3.  Be able to do 10 right single leg heel raises Goal status: INITIAL  4.  Jog and change directions without hesitation Goal status: INITIAL  5.  No pain Goal status: INITIAL  PLAN:  PT FREQUENCY: 1-2x/week  PT DURATION: 12 weeks  PLANNED INTERVENTIONS: Therapeutic exercises, Therapeutic activity, Neuromuscular re-education, Balance training, Gait training, Patient/Family education, Self Care, Joint mobilization, Dry Needling, Electrical  stimulation, Cryotherapy, scar mobilization, Taping, Vasopneumatic device, Ultrasound, and Manual therapy  PLAN FOR NEXT SESSION: continue to work on calf strengthening    Cassie Freer, PT 05/07/2023, 5:54 PM

## 2023-05-20 ENCOUNTER — Ambulatory Visit: Payer: BC Managed Care – PPO | Admitting: Physical Therapy

## 2023-05-20 ENCOUNTER — Encounter: Payer: Self-pay | Admitting: Physical Therapy

## 2023-05-20 DIAGNOSIS — R262 Difficulty in walking, not elsewhere classified: Secondary | ICD-10-CM

## 2023-05-20 DIAGNOSIS — M6281 Muscle weakness (generalized): Secondary | ICD-10-CM

## 2023-05-20 DIAGNOSIS — M25671 Stiffness of right ankle, not elsewhere classified: Secondary | ICD-10-CM

## 2023-05-20 NOTE — Therapy (Signed)
OUTPATIENT PHYSICAL THERAPY LOWER EXTREMITY TREATMENT   Patient Name: Mike Nicholson MRN: [id number] DOB:[birth year], 47 y.o., male Today's Date: 05/20/2023  END OF SESSION:  PT End of Session - 05/20/23 1742     Visit Number 3    Date for PT Re-Evaluation 07/24/23    Authorization Type [employer]    PT Start Time 1742    PT Stop Time 1830    PT Time Calculation (min) 48 min    Activity Tolerance Patient tolerated treatment well    Behavior During Therapy WFL for tasks assessed/performed              Past Medical History:  Diagnosis Date   First degree AV block    Past Surgical History:  Procedure Laterality Date   none     There are no problems to display for this patient.   PCP: Odis Hollingshead, MD  REFERRING PROVIDER: Odis Hollingshead, MD  REFERRING DIAG: s/p right achilles tendon repair  THERAPY DIAG:  Stiffness of right ankle, not elsewhere classified  Difficulty in walking, not elsewhere classified  Muscle weakness (generalized)  Rationale for Evaluation and Treatment: Rehabilitation  ONSET DATE: 12/14/22  SUBJECTIVE:   SUBJECTIVE STATEMENT: I feel like I am getting a little better and a little stronger, still limping  PERTINENT HISTORY: none PAIN:  Are you having pain? Yes: NPRS scale: 0/10 Pain location: right achilles Pain description: sore/ ache Aggravating factors: walking trying to raise up on toes Relieving factors: rest,   PRECAUTIONS: None  RED FLAGS: None   WEIGHT BEARING RESTRICTIONS: No  FALLS:  Has patient fallen in last 6 months? No  LIVING ENVIRONMENT: Lives with: lives with their family Lives in: House/apartment Stairs: Yes: Internal: 12 steps; can reach both tends to do one at a time  Has following equipment at home: None  OCCUPATION: office  PLOF: Independent and some weights, did some soccer , golf 1x/week, tennis 1x/week  PATIENT GOALS: be stronger  NEXT MD VISIT: 6 weeks  OBJECTIVE:   DIAGNOSTIC FINDINGS: tendon  rupture  PATIENT SURVEYS:  FOTO 58  COGNITION: Overall cognitive status: Within functional limits for tasks assessed     SENSATION: WFL  EDEMA:  Mild achilles swelling  MUSCLE LENGTH: Tight calves   POSTURE: weight shift left  PALPATION: Decreased mm tone  LOWER EXTREMITY ROM:  Active ROM Right eval Left eval  Hip flexion    Hip extension    Hip abduction    Hip adduction    Hip internal rotation    Hip external rotation    Knee flexion    Knee extension    Ankle dorsiflexion 11   Ankle plantarflexion    Ankle inversion    Ankle eversion     (Blank rows = not tested)  LOWER EXTREMITY MMT:  MMT Right eval Left eval  Hip flexion    Hip extension    Hip abduction    Hip adduction    Hip internal rotation    Hip external rotation    Knee flexion    Knee extension    Ankle dorsiflexion 4+   Ankle plantarflexion 3+ Cannot do a right leg calf raisex1, cannot get heel off the ground   Ankle inversion    Ankle eversion     (Blank rows = not tested)  LOWER EXTREMITY SPECIAL TESTS:  Ankle special tests: SLS on the right 10 seconds  FUNCTIONAL TESTS:  5 times sit to stand: 15 seconds Timed up and go (TUG): 11 seconds Right  calf circumference 3" below the tibial tubercle 39.5cm , left 40.5 cm GAIT: Distance walked: 100 feet Assistive device utilized: None Level of assistance: Complete Independence Comments: has decreased ROM at toe off, has right hip rotate back at toe off, reports at home he does stairs one at a time   TODAY'S TREATMENT:                                                                                                                              DATE:   05/20/23 Nustep level 5 LE only x 5 minutes Slant board stretches Calf raises on bar, worked on right eccentrics Outside light jog, slow direction changes, backward light jog, light side shuffling Slow to medium speed soccer drill using the curb Resisted gait all directions with  black tband 50#, 70# and 90# forward resisted gait and cues to focus on slow right eccentric coming backward Single leg stance ball toss, single leg stance on airex SLS on the dyna disc 7 reps all motions Reviewed HEP and for him to add some SLS at home Leg press 20# right calf eccentics , tried right only as well but weak on the PF Tmill off pushes 20 s x 4   05/07/23 Bike L3x83mins Calf raises on bar 2x12 Calf stretch on slant 30s  AnkleTB green x20 each way  SLS 10s, SLS with catch 8s at best  SLS on airex 10s  Step ups 6" Eccentric heel raises 2x10 Walking on beam  Tandem on foam Resisted gait 30# 4 way x4 20# seated soleus raises 2x10  PATIENT EDUCATION:  Education details: HEP/POC Person educated: Patient Education method: Programmer, multimedia, Demonstration, Tactile cues, and Verbal cues Education comprehension: verbalized understanding, returned demonstration, verbal cues required, and tactile cues required  HOME EXERCISE PROGRAM: Black tband ankle PF, eccentric right heel raises  ASSESSMENT:  CLINICAL IMPRESSION: Patient is a 47 y.o. male who was seen today for physical therapy treatment for right achilles tendon repair on 12/14/22. Added some dynamic activities today, no reports of pain, reports mostly some fear and a little tightness in the calf, he does have trouble with proprioception and really struggled with SLS.  He struggles with concentric and eccentric strength on the right has limited end range PF due to weakness  OBJECTIVE IMPAIRMENTS: Abnormal gait, cardiopulmonary status limiting activity, decreased activity tolerance, decreased balance, decreased coordination, decreased endurance, decreased mobility, difficulty walking, decreased ROM, decreased strength, increased edema, increased muscle spasms, impaired flexibility, and pain.   REHAB POTENTIAL: Good  CLINICAL DECISION MAKING: Stable/uncomplicated  EVALUATION COMPLEXITY: Low   GOALS: Goals reviewed with  patient? Yes  SHORT TERM GOALS: Target date: 05/06/23 Independent with initial HEP Goal status: met 05/20/23  LONG TERM GOALS: Target date: 07/24/23  Independent with advanced HEP Goal status: INITIAL  2.  Increase right ankle DF to 15 degrees Goal status: INITIAL  3.  Be able to do 10 right single leg heel raises Goal status:  progressing 05/20/23  4.  Jog and change directions without hesitation Goal status: progressing 05/20/23  5.  No pain Goal status:  progressing 05/20/23  PLAN:  PT FREQUENCY: 1-2x/week  PT DURATION: 12 weeks  PLANNED INTERVENTIONS: Therapeutic exercises, Therapeutic activity, Neuromuscular re-education, Balance training, Gait training, Patient/Family education, Self Care, Joint mobilization, Dry Needling, Electrical stimulation, Cryotherapy, scar mobilization, Taping, Vasopneumatic device, Ultrasound, and Manual therapy  PLAN FOR NEXT SESSION: continue to work on calf strengthening, propiroception   Shavonte Zhao W, PT 05/20/2023, 5:43 PM

## 2023-05-27 ENCOUNTER — Encounter: Payer: Self-pay | Admitting: Physical Therapy

## 2023-05-27 ENCOUNTER — Ambulatory Visit: Payer: BC Managed Care – PPO | Admitting: Physical Therapy

## 2023-05-27 DIAGNOSIS — M25671 Stiffness of right ankle, not elsewhere classified: Secondary | ICD-10-CM

## 2023-05-27 DIAGNOSIS — M6281 Muscle weakness (generalized): Secondary | ICD-10-CM

## 2023-05-27 DIAGNOSIS — R262 Difficulty in walking, not elsewhere classified: Secondary | ICD-10-CM

## 2023-05-27 NOTE — Therapy (Signed)
OUTPATIENT PHYSICAL THERAPY LOWER EXTREMITY TREATMENT   Patient Name: Mike Nicholson MRN: [id number] DOB:1975/10/17, 47 y.o., male Today's Date: 05/27/2023  END OF SESSION:  PT End of Session - 05/27/23 1745     Visit Number 4    Date for PT Re-Evaluation 07/24/23    Authorization Type [employer]    PT Start Time 1744    PT Stop Time 1830    PT Time Calculation (min) 46 min    Activity Tolerance Patient tolerated treatment well    Behavior During Therapy WFL for tasks assessed/performed              Past Medical History:  Diagnosis Date   First degree AV block    Past Surgical History:  Procedure Laterality Date   none     There are no problems to display for this patient.   PCP: Odis Hollingshead, MD  REFERRING PROVIDER: Odis Hollingshead, MD  REFERRING DIAG: s/p right achilles tendon repair  THERAPY DIAG:  Stiffness of right ankle, not elsewhere classified  Difficulty in walking, not elsewhere classified  Muscle weakness (generalized)  Rationale for Evaluation and Treatment: Rehabilitation  ONSET DATE: 12/14/22  SUBJECTIVE:   SUBJECTIVE STATEMENT: I was a little sore but not bad, still feel the weakness  PERTINENT HISTORY: none PAIN:  Are you having pain? Yes: NPRS scale: 0/10 Pain location: right achilles Pain description: sore/ ache Aggravating factors: walking trying to raise up on toes Relieving factors: rest,   PRECAUTIONS: None  RED FLAGS: None   WEIGHT BEARING RESTRICTIONS: No  FALLS:  Has patient fallen in last 6 months? No  LIVING ENVIRONMENT: Lives with: lives with their family Lives in: House/apartment Stairs: Yes: Internal: 12 steps; can reach both tends to do one at a time  Has following equipment at home: None  OCCUPATION: office  PLOF: Independent and some weights, did some soccer , golf 1x/week, tennis 1x/week  PATIENT GOALS: be stronger  NEXT MD VISIT: 6 weeks  OBJECTIVE:   DIAGNOSTIC FINDINGS: tendon rupture  PATIENT  SURVEYS:  FOTO 58  COGNITION: Overall cognitive status: Within functional limits for tasks assessed     SENSATION: WFL  EDEMA:  Mild achilles swelling  MUSCLE LENGTH: Tight calves   POSTURE: weight shift left  PALPATION: Decreased mm tone  LOWER EXTREMITY ROM:  Active ROM Right eval Left eval  Hip flexion    Hip extension    Hip abduction    Hip adduction    Hip internal rotation    Hip external rotation    Knee flexion    Knee extension    Ankle dorsiflexion 11   Ankle plantarflexion    Ankle inversion    Ankle eversion     (Blank rows = not tested)  LOWER EXTREMITY MMT:  MMT Right eval Left eval  Hip flexion    Hip extension    Hip abduction    Hip adduction    Hip internal rotation    Hip external rotation    Knee flexion    Knee extension    Ankle dorsiflexion 4+   Ankle plantarflexion 3+ Cannot do a right leg calf raisex1, cannot get heel off the ground   Ankle inversion    Ankle eversion     (Blank rows = not tested)  LOWER EXTREMITY SPECIAL TESTS:  Ankle special tests: SLS on the right 10 seconds  FUNCTIONAL TESTS:  5 times sit to stand: 15 seconds Timed up and go (TUG): 11 seconds Right calf circumference 3"  below the tibial tubercle 39.5cm , left 40.5 cm GAIT: Distance walked: 100 feet Assistive device utilized: None Level of assistance: Complete Independence Comments: has decreased ROM at toe off, has right hip rotate back at toe off, reports at home he does stairs one at a time   TODAY'S TREATMENT:                                                                                                                              DATE:   05/27/23 Nustep level 6 x 5 minutes LE only Slant board stretches Tmill off pushes fwd and backward 20# right leg calf press some eccentrics with PT help, then both legs larger ROM calf press 60# Side shuffle with ball toss On wobble board right SLS On dyna disc right SLS On bosu right SLS On  minitramp march, bounce Manual resistance DF with knee straight and with knee bent Added here and at home Soleus work Light jog with a little faster, difficulty slowing down  05/20/23 Nustep level 5 LE only x 5 minutes Slant board stretches Calf raises on bar, worked on right eccentrics Outside light jog, slow direction changes, backward light jog, light side shuffling Slow to medium speed soccer drill using the curb Resisted gait all directions with black tband 50#, 70# and 90# forward resisted gait and cues to focus on slow right eccentric coming backward Single leg stance ball toss, single leg stance on airex SLS on the dyna disc 7 reps all motions Reviewed HEP and for him to add some SLS at home Leg press 20# right calf eccentics , tried right only as well but weak on the PF Tmill off pushes 20 s x 4   05/07/23 Bike L3x14mins Calf raises on bar 2x12 Calf stretch on slant 30s  AnkleTB green x20 each way  SLS 10s, SLS with catch 8s at best  SLS on airex 10s  Step ups 6" Eccentric heel raises 2x10 Walking on beam  Tandem on foam Resisted gait 30# 4 way x4 20# seated soleus raises 2x10  PATIENT EDUCATION:  Education details: HEP/POC Person educated: Patient Education method: Programmer, multimedia, Demonstration, Tactile cues, and Verbal cues Education comprehension: verbalized understanding, returned demonstration, verbal cues required, and tactile cues required  HOME EXERCISE PROGRAM: Black tband ankle PF, eccentric right heel raises  ASSESSMENT:  CLINICAL IMPRESSION: Patient is a 47 y.o. male who was seen today for physical therapy treatment for right achilles tendon repair on 12/14/22. Continue to add strength, proprioception and higher level activities, he did much better with side shuffle, still struggles with toe walking and PF, he does seem to have weak Soleus so added that today  OBJECTIVE IMPAIRMENTS: Abnormal gait, cardiopulmonary status limiting activity, decreased  activity tolerance, decreased balance, decreased coordination, decreased endurance, decreased mobility, difficulty walking, decreased ROM, decreased strength, increased edema, increased muscle spasms, impaired flexibility, and pain.   REHAB POTENTIAL: Good  CLINICAL DECISION MAKING: Stable/uncomplicated  EVALUATION COMPLEXITY:  Low   GOALS: Goals reviewed with patient? Yes  SHORT TERM GOALS: Target date: 05/06/23 Independent with initial HEP Goal status: met 05/20/23  LONG TERM GOALS: Target date: 07/24/23  Independent with advanced HEP Goal status: progressing 05/27/23  2.  Increase right ankle DF to 15 degrees Goal status: INITIAL  3.  Be able to do 10 right single leg heel raises Goal status: progressing 05/20/23  4.  Jog and change directions without hesitation Goal status: progressing 05/20/23  5.  No pain Goal status:  met 05/27/23  PLAN:  PT FREQUENCY: 1-2x/week  PT DURATION: 12 weeks  PLANNED INTERVENTIONS: Therapeutic exercises, Therapeutic activity, Neuromuscular re-education, Balance training, Gait training, Patient/Family education, Self Care, Joint mobilization, Dry Needling, Electrical stimulation, Cryotherapy, scar mobilization, Taping, Vasopneumatic device, Ultrasound, and Manual therapy  PLAN FOR NEXT SESSION: continue to work on calf strengthening, propiroception   Nichola Warren W, PT 05/27/2023, 5:45 PM

## 2023-06-03 ENCOUNTER — Encounter: Payer: Self-pay | Admitting: Physical Therapy

## 2023-06-03 ENCOUNTER — Ambulatory Visit: Payer: BC Managed Care – PPO | Attending: Orthopaedic Surgery | Admitting: Physical Therapy

## 2023-06-03 DIAGNOSIS — M25671 Stiffness of right ankle, not elsewhere classified: Secondary | ICD-10-CM | POA: Diagnosis present

## 2023-06-03 DIAGNOSIS — M6281 Muscle weakness (generalized): Secondary | ICD-10-CM | POA: Insufficient documentation

## 2023-06-03 DIAGNOSIS — R262 Difficulty in walking, not elsewhere classified: Secondary | ICD-10-CM | POA: Insufficient documentation

## 2023-06-03 NOTE — Therapy (Signed)
OUTPATIENT PHYSICAL THERAPY LOWER EXTREMITY TREATMENT   Patient Name: Mike Nicholson MRN: [id number] DOB:06/26/1976, 47 y.o., male Today's Date: 06/03/2023  END OF SESSION:  PT End of Session - 06/03/23 1744     Visit Number 5    Date for PT Re-Evaluation 07/24/23    Authorization Type [employer]    PT Start Time 1740    PT Stop Time 1825    PT Time Calculation (min) 45 min    Activity Tolerance Patient tolerated treatment well    Behavior During Therapy WFL for tasks assessed/performed              Past Medical History:  Diagnosis Date   First degree AV block    Past Surgical History:  Procedure Laterality Date   none     There are no problems to display for this patient.   PCP: Odis Hollingshead, MD  REFERRING PROVIDER: Odis Hollingshead, MD  REFERRING DIAG: s/p right achilles tendon repair  THERAPY DIAG:  Stiffness of right ankle, not elsewhere classified  Difficulty in walking, not elsewhere classified  Muscle weakness (generalized)  Rationale for Evaluation and Treatment: Rehabilitation  ONSET DATE: 12/14/22  SUBJECTIVE:   SUBJECTIVE STATEMENT: C/o a little heel pain bottom with a lot of walking, reports it goes away quickly  PERTINENT HISTORY: none PAIN:  Are you having pain? Yes: NPRS scale: 0/10 Pain location: right achilles Pain description: sore/ ache Aggravating factors: walking trying to raise up on toes Relieving factors: rest,   PRECAUTIONS: None  RED FLAGS: None   WEIGHT BEARING RESTRICTIONS: No  FALLS:  Has patient fallen in last 6 months? No  LIVING ENVIRONMENT: Lives with: lives with their family Lives in: House/apartment Stairs: Yes: Internal: 12 steps; can reach both tends to do one at a time  Has following equipment at home: None  OCCUPATION: office  PLOF: Independent and some weights, did some soccer , golf 1x/week, tennis 1x/week  PATIENT GOALS: be stronger  NEXT MD VISIT: 6 weeks  OBJECTIVE:   DIAGNOSTIC FINDINGS: tendon  rupture  PATIENT SURVEYS:  FOTO 58  COGNITION: Overall cognitive status: Within functional limits for tasks assessed     SENSATION: WFL  EDEMA:  Mild achilles swelling  MUSCLE LENGTH: Tight calves   POSTURE: weight shift left  PALPATION: Decreased mm tone  LOWER EXTREMITY ROM:  Active ROM Right eval Left eval  Hip flexion    Hip extension    Hip abduction    Hip adduction    Hip internal rotation    Hip external rotation    Knee flexion    Knee extension    Ankle dorsiflexion 11   Ankle plantarflexion    Ankle inversion    Ankle eversion     (Blank rows = not tested)  LOWER EXTREMITY MMT:  MMT Right eval Left eval  Hip flexion    Hip extension    Hip abduction    Hip adduction    Hip internal rotation    Hip external rotation    Knee flexion    Knee extension    Ankle dorsiflexion 4+   Ankle plantarflexion 3+ Cannot do a right leg calf raisex1, cannot get heel off the ground   Ankle inversion    Ankle eversion     (Blank rows = not tested)  LOWER EXTREMITY SPECIAL TESTS:  Ankle special tests: SLS on the right 10 seconds  FUNCTIONAL TESTS:  5 times sit to stand: 15 seconds Timed up and go (TUG): 11 seconds  Right calf circumference 3" below the tibial tubercle 39.5cm , left 40.5 cm GAIT: Distance walked: 100 feet Assistive device utilized: None Level of assistance: Complete Independence Comments: has decreased ROM at toe off, has right hip rotate back at toe off, reports at home he does stairs one at a time   TODAY'S TREATMENT:                                                                                                                              DATE:   06/03/23 Bike level 5 x 6 minutes with 3 power bursts Calf stretches gastroc and soleus Heels on bar toe raises Toes on bar heel raises with straight leg and with bent knees Calf raise on leg press 20# right only x 10, then eccentrics with the right Side stepping over sticks and then  going forward after the last one Jump rope Fast feet 6" step soccer drill Side to side aerobic steps over 6" step SLS ball toss On Bosu upside down squats Minitramp bounce and jog  05/27/23 Nustep level 6 x 5 minutes LE only Slant board stretches Tmill off pushes fwd and backward 20# right leg calf press some eccentrics with PT help, then both legs larger ROM calf press 60# Side shuffle with ball toss On wobble board right SLS On dyna disc right SLS On bosu right SLS On minitramp march, bounce Manual resistance DF with knee straight and with knee bent Added here and at home Soleus work Light jog with a little faster, difficulty slowing down  05/20/23 Nustep level 5 LE only x 5 minutes Slant board stretches Calf raises on bar, worked on right eccentrics Outside light jog, slow direction changes, backward light jog, light side shuffling Slow to medium speed soccer drill using the curb Resisted gait all directions with black tband 50#, 70# and 90# forward resisted gait and cues to focus on slow right eccentric coming backward Single leg stance ball toss, single leg stance on airex SLS on the dyna disc 7 reps all motions Reviewed HEP and for him to add some SLS at home Leg press 20# right calf eccentics , tried right only as well but weak on the PF Tmill off pushes 20 s x 4   05/07/23 Bike L3x76mins Calf raises on bar 2x12 Calf stretch on slant 30s  AnkleTB green x20 each way  SLS 10s, SLS with catch 8s at best  SLS on airex 10s  Step ups 6" Eccentric heel raises 2x10 Walking on beam  Tandem on foam Resisted gait 30# 4 way x4 20# seated soleus raises 2x10  PATIENT EDUCATION:  Education details: HEP/POC Person educated: Patient Education method: Programmer, multimedia, Demonstration, Tactile cues, and Verbal cues Education comprehension: verbalized understanding, returned demonstration, verbal cues required, and tactile cues required  HOME EXERCISE PROGRAM: Black tband ankle  PF, eccentric right heel raises  ASSESSMENT:  CLINICAL IMPRESSION: Patient is a 46 y.o. male who was seen today  for physical therapy treatment for right achilles tendon repair on 12/14/22. Patient is starting to improve with the ability to raise the heel up higher, he seems to trust it a little more, reports heel pain with a lot of walking and feels stiff after sitting.  He will see the MD this Friday  OBJECTIVE IMPAIRMENTS: Abnormal gait, cardiopulmonary status limiting activity, decreased activity tolerance, decreased balance, decreased coordination, decreased endurance, decreased mobility, difficulty walking, decreased ROM, decreased strength, increased edema, increased muscle spasms, impaired flexibility, and pain.   REHAB POTENTIAL: Good  CLINICAL DECISION MAKING: Stable/uncomplicated  EVALUATION COMPLEXITY: Low   GOALS: Goals reviewed with patient? Yes  SHORT TERM GOALS: Target date: 05/06/23 Independent with initial HEP Goal status: met 05/20/23  LONG TERM GOALS: Target date: 07/24/23  Independent with advanced HEP Goal status: progressing 05/27/23  2.  Increase right ankle DF to 15 degrees Goal status: progressing 06/03/23  3.  Be able to do 10 right single leg heel raises Goal status: progressing 05/20/23  4.  Jog and change directions without hesitation Goal status: progressing 05/20/23  5.  No pain Goal status:  met 05/27/23  PLAN:  PT FREQUENCY: 1-2x/week  PT DURATION: 12 weeks  PLANNED INTERVENTIONS: Therapeutic exercises, Therapeutic activity, Neuromuscular re-education, Balance training, Gait training, Patient/Family education, Self Care, Joint mobilization, Dry Needling, Electrical stimulation, Cryotherapy, scar mobilization, Taping, Vasopneumatic device, Ultrasound, and Manual therapy  PLAN FOR NEXT SESSION: continue to work on calf strengthening, proprioception, see if the MD has any advice for him for PT   Jearld Lesch, PT 06/03/2023, 5:45 PM

## 2023-06-10 ENCOUNTER — Ambulatory Visit: Payer: BC Managed Care – PPO | Admitting: Physical Therapy

## 2023-06-10 ENCOUNTER — Encounter: Payer: Self-pay | Admitting: Physical Therapy

## 2023-06-10 DIAGNOSIS — M25671 Stiffness of right ankle, not elsewhere classified: Secondary | ICD-10-CM | POA: Diagnosis not present

## 2023-06-10 DIAGNOSIS — R262 Difficulty in walking, not elsewhere classified: Secondary | ICD-10-CM

## 2023-06-10 DIAGNOSIS — M6281 Muscle weakness (generalized): Secondary | ICD-10-CM

## 2023-06-10 NOTE — Therapy (Signed)
OUTPATIENT PHYSICAL THERAPY LOWER EXTREMITY TREATMENT   Patient Name: Mike Nicholson MRN: [id number] DOB:1975-12-05, 47 y.o., male Today's Date: 06/10/2023  END OF SESSION:  PT End of Session - 06/10/23 1748     Visit Number 6    Date for PT Re-Evaluation 07/24/23    Authorization Type [employer]    PT Start Time 1745    PT Stop Time 1830    PT Time Calculation (min) 45 min    Activity Tolerance Patient tolerated treatment well    Behavior During Therapy WFL for tasks assessed/performed              Past Medical History:  Diagnosis Date   First degree AV block    Past Surgical History:  Procedure Laterality Date   none     There are no problems to display for this patient.   PCP: Odis Hollingshead, MD  REFERRING PROVIDER: Odis Hollingshead, MD  REFERRING DIAG: s/p right achilles tendon repair  THERAPY DIAG:  Stiffness of right ankle, not elsewhere classified  Difficulty in walking, not elsewhere classified  Muscle weakness (generalized)  Rationale for Evaluation and Treatment: Rehabilitation  ONSET DATE: 12/14/22  SUBJECTIVE:   SUBJECTIVE STATEMENT: Patient reports that he was very busy, did not get a chance to see MD, reports no pain, tightness PERTINENT HISTORY: none PAIN:  Are you having pain? Yes: NPRS scale: 0/10 Pain location: right achilles Pain description: sore/ ache Aggravating factors: walking trying to raise up on toes Relieving factors: rest,   PRECAUTIONS: None  RED FLAGS: None   WEIGHT BEARING RESTRICTIONS: No  FALLS:  Has patient fallen in last 6 months? No  LIVING ENVIRONMENT: Lives with: lives with their family Lives in: House/apartment Stairs: Yes: Internal: 12 steps; can reach both tends to do one at a time  Has following equipment at home: None  OCCUPATION: office  PLOF: Independent and some weights, did some soccer , golf 1x/week, tennis 1x/week  PATIENT GOALS: be stronger  NEXT MD VISIT: 6 weeks  OBJECTIVE:   DIAGNOSTIC  FINDINGS: tendon rupture  PATIENT SURVEYS:  FOTO 58  COGNITION: Overall cognitive status: Within functional limits for tasks assessed     SENSATION: WFL  EDEMA:  Mild achilles swelling  MUSCLE LENGTH: Tight calves   POSTURE: weight shift left  PALPATION: Decreased mm tone  LOWER EXTREMITY ROM:  Active ROM Right eval Left eval  Hip flexion    Hip extension    Hip abduction    Hip adduction    Hip internal rotation    Hip external rotation    Knee flexion    Knee extension    Ankle dorsiflexion 11   Ankle plantarflexion    Ankle inversion    Ankle eversion     (Blank rows = not tested)  LOWER EXTREMITY MMT:  MMT Right eval Left eval  Hip flexion    Hip extension    Hip abduction    Hip adduction    Hip internal rotation    Hip external rotation    Knee flexion    Knee extension    Ankle dorsiflexion 4+   Ankle plantarflexion 3+ Cannot do a right leg calf raisex1, cannot get heel off the ground   Ankle inversion    Ankle eversion     (Blank rows = not tested)  LOWER EXTREMITY SPECIAL TESTS:  Ankle special tests: SLS on the right 10 seconds  FUNCTIONAL TESTS:  5 times sit to stand: 15 seconds Timed up and go (TUG):  11 seconds Right calf circumference 3" below the tibial tubercle 39.5cm , left 40.5 cm GAIT: Distance walked: 100 feet Assistive device utilized: None Level of assistance: Complete Independence Comments: has decreased ROM at toe off, has right hip rotate back at toe off, reports at home he does stairs one at a time   TODAY'S TREATMENT:                                                                                                                              DATE:   06/10/23 Elliptical level 5 x 4 minutes Slant board stretch gastroc and soleus Tmill 2.5 mph walk x 1 minutes, up to 5 mph jog 30 seconds Plyometric on the leg press 20# push off and catch with toes and switch legs Calf raises 20# both and then worked on right leg  eccentrics and then right leg only On rocker board front to back and side to side On bosu ball toss On dynadisc PF/DF then balance On airex on right leg cone toe touches varying degrees of difficulty STM to the scar/achilles and into the calf  06/03/23 Bike level 5 x 6 minutes with 3 power bursts Calf stretches gastroc and soleus Heels on bar toe raises Toes on bar heel raises with straight leg and with bent knees Calf raise on leg press 20# right only x 10, then eccentrics with the right Side stepping over sticks and then going forward after the last one Jump rope Fast feet 6" step soccer drill Side to side aerobic steps over 6" step SLS ball toss On Bosu upside down squats Minitramp bounce and jog  05/27/23 Nustep level 6 x 5 minutes LE only Slant board stretches Tmill off pushes fwd and backward 20# right leg calf press some eccentrics with PT help, then both legs larger ROM calf press 60# Side shuffle with ball toss On wobble board right SLS On dyna disc right SLS On bosu right SLS On minitramp march, bounce Manual resistance DF with knee straight and with knee bent Added here and at home Soleus work Light jog with a little faster, difficulty slowing down  05/20/23 Nustep level 5 LE only x 5 minutes Slant board stretches Calf raises on bar, worked on right eccentrics Outside light jog, slow direction changes, backward light jog, light side shuffling Slow to medium speed soccer drill using the curb Resisted gait all directions with black tband 50#, 70# and 90# forward resisted gait and cues to focus on slow right eccentric coming backward Single leg stance ball toss, single leg stance on airex SLS on the dyna disc 7 reps all motions Reviewed HEP and for him to add some SLS at home Leg press 20# right calf eccentics , tried right only as well but weak on the PF Tmill off pushes 20 s x 4   05/07/23 Bike L3x15mins Calf raises on bar 2x12 Calf stretch on slant 30s   AnkleTB green x20 each  way  SLS 10s, SLS with catch 8s at best  SLS on airex 10s  Step ups 6" Eccentric heel raises 2x10 Walking on beam  Tandem on foam Resisted gait 30# 4 way x4 20# seated soleus raises 2x10  PATIENT EDUCATION:  Education details: HEP/POC Person educated: Patient Education method: Programmer, multimedia, Demonstration, Tactile cues, and Verbal cues Education comprehension: verbalized understanding, returned demonstration, verbal cues required, and tactile cues required  HOME EXERCISE PROGRAM: Black tband ankle PF, eccentric right heel raises  ASSESSMENT:  CLINICAL IMPRESSION: Patient is a 47 y.o. male who was seen today for physical therapy treatment for right achilles tendon repair on 12/14/22. Patient doing well, not sore, reports that he did not get to see the MD and will try to call this week.  I added some light jogging and light plyometrics and continued to do the higher level proprioception  OBJECTIVE IMPAIRMENTS: Abnormal gait, cardiopulmonary status limiting activity, decreased activity tolerance, decreased balance, decreased coordination, decreased endurance, decreased mobility, difficulty walking, decreased ROM, decreased strength, increased edema, increased muscle spasms, impaired flexibility, and pain.   REHAB POTENTIAL: Good  CLINICAL DECISION MAKING: Stable/uncomplicated  EVALUATION COMPLEXITY: Low   GOALS: Goals reviewed with patient? Yes  SHORT TERM GOALS: Target date: 05/06/23 Independent with initial HEP Goal status: met 05/20/23  LONG TERM GOALS: Target date: 07/24/23  Independent with advanced HEP Goal status: progressing 05/27/23  2.  Increase right ankle DF to 15 degrees Goal status: progressing 06/03/23  3.  Be able to do 10 right single leg heel raises Goal status: progressing 06/10/23  4.  Jog and change directions without hesitation Goal status: progressing 06/10/23  5.  No pain Goal status:  met 05/27/23  PLAN:  PT  FREQUENCY: 1-2x/week  PT DURATION: 12 weeks  PLANNED INTERVENTIONS: Therapeutic exercises, Therapeutic activity, Neuromuscular re-education, Balance training, Gait training, Patient/Family education, Self Care, Joint mobilization, Dry Needling, Electrical stimulation, Cryotherapy, scar mobilization, Taping, Vasopneumatic device, Ultrasound, and Manual therapy  PLAN FOR NEXT SESSION: continue to work on calf strengthening, proprioception, see if the MD has any advice for him for PT   Jearld Lesch, PT 06/10/2023, 5:49 PM
